# Patient Record
Sex: Male | Born: 1995 | Race: White | Hispanic: No | Marital: Single | Smoking: Never smoker
Health system: Southern US, Community
[De-identification: ages and names within clinical notes are randomized; demographics above are authoritative.]

## PROBLEM LIST (undated history)

## (undated) DIAGNOSIS — W44F3XA Food entering into or through a natural orifice, initial encounter: Secondary | ICD-10-CM

## (undated) DIAGNOSIS — T18128A Food in esophagus causing other injury, initial encounter: Secondary | ICD-10-CM

---

## 2016-08-29 ENCOUNTER — Encounter (HOSPITAL_BASED_OUTPATIENT_CLINIC_OR_DEPARTMENT_OTHER): Payer: Self-pay | Admitting: Emergency Medicine

## 2016-08-29 ENCOUNTER — Emergency Department (HOSPITAL_BASED_OUTPATIENT_CLINIC_OR_DEPARTMENT_OTHER)
Admission: EM | Admit: 2016-08-29 | Discharge: 2016-08-30 | Disposition: A | Discharge status: Home / Self Care | Payer: Self-pay | Attending: Emergency Medicine | Admitting: Emergency Medicine

## 2016-08-29 DIAGNOSIS — Y69 Unspecified misadventure during surgical and medical care: Secondary | ICD-10-CM | POA: Insufficient documentation

## 2016-08-29 DIAGNOSIS — R251 Tremor, unspecified: Secondary | ICD-10-CM | POA: Insufficient documentation

## 2016-08-29 DIAGNOSIS — T887XXA Unspecified adverse effect of drug or medicament, initial encounter: Secondary | ICD-10-CM | POA: Insufficient documentation

## 2016-08-29 DIAGNOSIS — T43615A Adverse effect of caffeine, initial encounter: Secondary | ICD-10-CM | POA: Insufficient documentation

## 2016-08-29 DIAGNOSIS — R202 Paresthesia of skin: Secondary | ICD-10-CM | POA: Insufficient documentation

## 2016-08-29 LAB — URINALYSIS, ROUTINE W REFLEX MICROSCOPIC
Bilirubin Urine: NEGATIVE
GLUCOSE, UA: NEGATIVE mg/dL
HGB URINE DIPSTICK: NEGATIVE
KETONES UR: NEGATIVE mg/dL
Leukocytes, UA: NEGATIVE
Nitrite: NEGATIVE
PROTEIN: NEGATIVE mg/dL
Specific Gravity, Urine: 1.009 (ref 1.005–1.030)
pH: 7.5 (ref 5.0–8.0)

## 2016-08-29 LAB — CBC WITH DIFFERENTIAL/PLATELET
BASOS PCT: 1 %
Basophils Absolute: 0.1 10*3/uL (ref 0.0–0.1)
EOS ABS: 0.5 10*3/uL (ref 0.0–0.7)
EOS PCT: 5 %
HCT: 44.2 % (ref 39.0–52.0)
HEMOGLOBIN: 15 g/dL (ref 13.0–17.0)
Lymphocytes Relative: 26 %
Lymphs Abs: 2.7 10*3/uL (ref 0.7–4.0)
MCH: 29.9 pg (ref 26.0–34.0)
MCHC: 33.9 g/dL (ref 30.0–36.0)
MCV: 88 fL (ref 78.0–100.0)
Monocytes Absolute: 0.7 10*3/uL (ref 0.1–1.0)
Monocytes Relative: 7 %
NEUTROS PCT: 61 %
Neutro Abs: 6.6 10*3/uL (ref 1.7–7.7)
PLATELETS: 314 10*3/uL (ref 150–400)
RBC: 5.02 MIL/uL (ref 4.22–5.81)
RDW: 12.8 % (ref 11.5–15.5)
WBC: 10.5 10*3/uL (ref 4.0–10.5)

## 2016-08-29 LAB — COMPREHENSIVE METABOLIC PANEL
ALBUMIN: 4.6 g/dL (ref 3.5–5.0)
ALK PHOS: 69 U/L (ref 38–126)
ALT: 24 U/L (ref 17–63)
ANION GAP: 9 (ref 5–15)
AST: 24 U/L (ref 15–41)
BUN: 10 mg/dL (ref 6–20)
CALCIUM: 9.2 mg/dL (ref 8.9–10.3)
CHLORIDE: 102 mmol/L (ref 101–111)
CO2: 26 mmol/L (ref 22–32)
CREATININE: 0.91 mg/dL (ref 0.61–1.24)
GFR calc non Af Amer: >60 mL/min (ref >60)
GLUCOSE: 105 mg/dL — AB (ref 65–99)
Potassium: 3.6 mmol/L (ref 3.5–5.1)
SODIUM: 137 mmol/L (ref 135–145)
Total Bilirubin: 1 mg/dL (ref 0.3–1.2)
Total Protein: 7.8 g/dL (ref 6.5–8.1)

## 2016-08-29 LAB — RAPID URINE DRUG SCREEN, HOSP PERFORMED
Amphetamines: NOT DETECTED
BARBITURATES: NOT DETECTED
BENZODIAZEPINES: NOT DETECTED
COCAINE: NOT DETECTED
Opiates: NOT DETECTED
TETRAHYDROCANNABINOL: NOT DETECTED

## 2016-08-29 LAB — SALICYLATE LEVEL

## 2016-08-29 LAB — ACETAMINOPHEN LEVEL: Acetaminophen (Tylenol), Serum: <10 ug/mL — ABNORMAL LOW (ref 10–30)

## 2016-08-29 MED ORDER — SODIUM CHLORIDE 0.9 % IV BOLUS (SEPSIS)
1000.0000 mL | Freq: Once | INTRAVENOUS | Status: AC
  Administered 2016-08-29: 1000 mL via INTRAVENOUS

## 2016-08-29 NOTE — ED Triage Notes (Signed)
Patient states that about 45 minutes prior to arrival he started to have tingling and numbness all over his body after he drank a 2nd energy drink. THe patient reports that he now only has numbness and tingling to his hands and his head  - Neuro intact, reports that he is drinking a different kind of engery drink right now. Patient has small tremors and is very anxious in triage

## 2016-08-29 NOTE — ED Provider Notes (Signed)
[facility]   CSN: [id number] Arrival date & time: 08/29/16  4098     History   Chief Complaint Chief Complaint  Patient presents with  . Tingling    HPI Erik Brooks is a 21 y.o. male.  The history is provided by the patient and medical records. No language interpreter was used.  Neurologic Problem  This is a new problem. The current episode started 6 to 12 hours ago. The problem occurs constantly. The problem has been gradually improving. Pertinent negatives include no chest pain, no abdominal pain, no headaches and no shortness of breath. Nothing aggravates the symptoms. Nothing relieves the symptoms. He has tried nothing for the symptoms. The treatment provided no relief.    History reviewed. No pertinent past medical history.  There are no active problems to display for this patient.   History reviewed. No pertinent surgical history.     Home Medications    Prior to Admission medications   Not on File    Family History History reviewed. No pertinent family history.  Social History Social History  Substance Use Topics  . Smoking status: Never Smoker  . Smokeless tobacco: Never Used  . Alcohol use Yes     Comment: 1 - 2 times a week  - last yesterday      Allergies   Patient has no known allergies.   Review of Systems Review of Systems  Constitutional: Negative for chills, diaphoresis, fatigue and fever.  HENT: Negative for congestion, rhinorrhea and tinnitus.   Eyes: Negative for photophobia and visual disturbance.  Respiratory: Negative for chest tightness, shortness of breath, wheezing and stridor.   Cardiovascular: Negative for chest pain, palpitations and leg swelling.  Gastrointestinal: Negative for abdominal pain, constipation, diarrhea, nausea and vomiting.  Genitourinary: Negative for decreased urine volume, dysuria, flank pain, frequency and hematuria.  Musculoskeletal: Negative for back pain, neck pain and neck  stiffness.  Skin: Negative for rash and wound.  Neurological: Positive for tremors and numbness. Negative for dizziness, seizures, speech difficulty, weakness, light-headedness and headaches.  Psychiatric/Behavioral: Negative for agitation.  All other systems reviewed and are negative.    Physical Exam Updated Vital Signs BP (!) 132/93 (BP Location: Right Arm)   Pulse 69   Temp 98.4 F (36.9 C) (Oral)   Resp 17   Ht 5' 5.5" (1.664 m)   Wt 59 kg (130 lb)   SpO2 100%   BMI 21.30 kg/m   Physical Exam  Constitutional: He is oriented to person, place, and time. He appears well-developed and well-nourished.  HENT:  Head: Normocephalic and atraumatic.  Mouth/Throat: Oropharynx is clear and moist. No oropharyngeal exudate.  Eyes: Pupils are equal, round, and reactive to light. Conjunctivae and EOM are normal.  Neck: Normal range of motion. Neck supple.  Cardiovascular: Normal rate and intact distal pulses.  Exam reveals no gallop.   No murmur heard. Pulmonary/Chest: Effort normal and breath sounds normal. No stridor. No respiratory distress. He has no wheezes. He exhibits no tenderness.  Abdominal: Soft. There is no tenderness.  Musculoskeletal: He exhibits no edema or tenderness.  Neurological: He is alert and oriented to person, place, and time. He displays normal reflexes. No cranial nerve deficit or sensory deficit. He exhibits normal muscle tone. Coordination normal.  Skin: Skin is warm and dry. Capillary refill takes less than 2 seconds. No erythema. No pallor.  Psychiatric: He has a normal mood and affect.  Nursing note and vitals reviewed.    ED  Treatments / Results  Labs (all labs ordered are listed, but only abnormal results are displayed) Labs Reviewed  COMPREHENSIVE METABOLIC PANEL - Abnormal; Notable for the following:       Result Value   Glucose, Bld 105 (*)    All other components within normal limits  URINALYSIS, ROUTINE W REFLEX MICROSCOPIC - Abnormal;  Notable for the following:    APPearance CLOUDY (*)    All other components within normal limits  ACETAMINOPHEN LEVEL - Abnormal; Notable for the following:    Acetaminophen (Tylenol), Serum <10 (*)    All other components within normal limits  CBC WITH DIFFERENTIAL/PLATELET  RAPID URINE DRUG SCREEN, HOSP PERFORMED  SALICYLATE LEVEL    EKG  EKG Interpretation None      ED ECG REPORT   Date: 08/29/2016  Rate: 67  Rhythm: sinus arrhythmia  QRS Axis: normal  Intervals: normal  ST/T Wave abnormalities: normal  Conduction Disutrbances:none  Narrative Interpretation:   Old EKG Reviewed: none available  I have personally reviewed the EKG tracing and agree with the computerized printout as noted.    Radiology No results found.  Procedures Procedures (including critical care time)  Medications Ordered in ED Medications  sodium chloride 0.9 % bolus 1,000 mL (1,000 mLs Intravenous New Bag/Given 08/29/16 2304)     Initial Impression / Assessment and Plan / ED Course  I have reviewed the triage vital signs and the nursing notes.  Pertinent labs & imaging results that were available during my care of the patient were reviewed by me and considered in my medical decision making (see chart for details).     Erik Brooks is a 21 y.o. male With no significant past medical history who presents with diffuse body tingling and jitteriness. Patient says that today he is felt anxious and "jittery." He says that he is felt tingling all over his body". He says that the symptoms have improved but he still feel shaky. He reports that he is consumed several energy drinks today which he does not normally do. He denies drug use or alcohol use. He denies any coingestiants. He denies any allergies. He says he is not eating much food today.  On exam, patient is a mild tremor. Patient is sitting anxiously in the bed. Patient's lungs are clear. Abdomen nontender. No focal neurologic deficits. No  tingling or sensation abnormalities.  Patient had workup to look for intoxication or ingestion. UD is negative. No evidence of significant electrolyte abnormalities. Patient given fluids during workup.  Patient reports symptoms have improved after fluids. Suspect caffeine from numerous energy drinks causing his symptoms. Patient advised to slowly decrease his caffeine intake and follow up with a PCP for further management. Do not feel patient has infection or other intoxication causing his symptoms at this time. Patient voiced understanding of return precautions and plan of care. Patient and sibling agree with caffeine de-escalation. Patient had no other questions or concerns and was discharged in good condition.    Final Clinical Impressions(s) / ED Diagnoses   Final diagnoses:  Tremor  Tingling  Adverse effect of caffeine, initial encounter    New Prescriptions There are no discharge medications for this patient.   Clinical Impression: 1. Tremor   2. Tingling   3. Adverse effect of caffeine, initial encounter     Disposition: Discharge  Condition: Good  I have discussed the results, Dx and Tx plan with the pt(& family if present). He/she/they expressed understanding and agree(s) with the plan. Discharge instructions  discussed at great length. Strict return precautions discussed and pt &/or family have verbalized understanding of the instructions. No further questions at time of discharge.    There are no discharge medications for this patient.   Follow Up: [facility] [address] [place] [address] [phone] Schedule an appointment as soon as possible for a visit    [facility] [place] EMERGENCY DEPARTMENT [address] [id number] mc [address] [place] [address] [phone]  If symptoms worsen     Tegeler, Canary Brim, MD 08/30/16 [phone]

## 2016-08-30 NOTE — Discharge Instructions (Signed)
Your workup today was reassuring. We suspect your tingling and shaking was due to high caffeine intake. Please try and cut back your energy drink consumption and follow-up with a primary care physician. If any symptoms change or worsen, please return to the nearest emergency department.

## 2018-10-25 ENCOUNTER — Emergency Department (HOSPITAL_COMMUNITY): Payer: Commercial Managed Care - PPO

## 2018-10-25 ENCOUNTER — Encounter (HOSPITAL_COMMUNITY): Payer: Self-pay | Admitting: Obstetrics and Gynecology

## 2018-10-25 ENCOUNTER — Other Ambulatory Visit: Payer: Self-pay

## 2018-10-25 ENCOUNTER — Emergency Department (HOSPITAL_COMMUNITY)
Admission: EM | Admit: 2018-10-25 | Discharge: 2018-10-25 | Disposition: A | Discharge status: Home / Self Care | Payer: Commercial Managed Care - PPO | Attending: Emergency Medicine | Admitting: Emergency Medicine

## 2018-10-25 DIAGNOSIS — Y9389 Activity, other specified: Secondary | ICD-10-CM | POA: Diagnosis not present

## 2018-10-25 DIAGNOSIS — Y929 Unspecified place or not applicable: Secondary | ICD-10-CM | POA: Diagnosis not present

## 2018-10-25 DIAGNOSIS — X58XXXA Exposure to other specified factors, initial encounter: Secondary | ICD-10-CM | POA: Insufficient documentation

## 2018-10-25 DIAGNOSIS — Y999 Unspecified external cause status: Secondary | ICD-10-CM | POA: Diagnosis not present

## 2018-10-25 DIAGNOSIS — T18128A Food in esophagus causing other injury, initial encounter: Secondary | ICD-10-CM | POA: Diagnosis not present

## 2018-10-25 HISTORY — DX: Food in esophagus causing other injury, initial encounter: T18.128A

## 2018-10-25 HISTORY — DX: Food entering into or through a natural orifice, initial encounter: W44.F3XA

## 2018-10-25 MED ORDER — GLUCAGON HCL RDNA (DIAGNOSTIC) 1 MG IJ SOLR
1.0000 mg | Freq: Once | INTRAMUSCULAR | Status: AC
  Administered 2018-10-25: 1 mg via INTRAVENOUS
  Filled 2018-10-25: qty 1

## 2018-10-25 NOTE — ED Triage Notes (Signed)
Pt reports he has a food bolus in his throat. Patient states it has been stuck x2 hours and is a steak and cheese burrito. Patient reports this has happened before and he has had to have his esophagus stretched and the impaction removed.

## 2018-10-25 NOTE — ED Provider Notes (Signed)
[facility]-EMERGENCY DEPT Provider Note   CSN: [id number] Arrival date & time: 10/25/18  1041     History   Chief Complaint Chief Complaint  Patient presents with   Food Impaction    HPI Erik Brooks is a 23 y.o. male.     HPI 23 year old male past medical history significant for food impaction presents to the emergency department today for evaluation of food bolus.  Patient reports 2 hours ago he was eating a steak and cheese burrito when he felt it get stuck in his throat.  Patient reports he has been unable to swallow his secretions or p.o. fluids since then.  He reports vomiting.  He feels like it is stuck in his throat.  Patient states this is happened before approximately 1 year ago where he had an EGD performed that showed a lower esophageal food bolus.  This was able to be passed with the scope.  Patient has not followed up with GI since then.  He has had no further problems until today.  He has tried no medications for symptoms prior to arrival.  No alleviating or aggravating factors.  Patient denies any abdominal pain, melena or hematochezia.  No fevers or chills. Past Medical History:  Diagnosis Date   Food impaction of esophagus     There are no active problems to display for this patient.   History reviewed. No pertinent surgical history.      Home Medications    Prior to Admission medications   Not on File    Family History No family history on file.  Social History Social History   Tobacco Use   Smoking status: Never Smoker   Smokeless tobacco: Never Used  Substance Use Topics   Alcohol use: Yes    Comment: 1 - 2 times a week  - last yesterday    Drug use: No     Allergies   Patient has no known allergies.   Review of Systems Review of Systems  Constitutional: Negative for chills and fever.  HENT: Positive for trouble swallowing.   Eyes: Negative for discharge.  Respiratory: Negative for cough and shortness  of breath.   Cardiovascular: Negative for chest pain.  Gastrointestinal: Negative for abdominal pain, blood in stool, diarrhea, nausea and vomiting.  Genitourinary: Negative for dysuria.  Musculoskeletal: Negative for myalgias.  Skin: Negative for rash.  Neurological: Negative for seizures, numbness and headaches.  Psychiatric/Behavioral: Negative for confusion.     Physical Exam Updated Vital Signs BP (!) 142/89 (BP Location: Right Arm)    Pulse (!) 110    Temp 98.9 F (37.2 C) (Oral)    Resp 17    SpO2 98%   Physical Exam Vitals signs and nursing note reviewed.  Constitutional:      General: He is not in acute distress.    Appearance: He is well-developed. He is not ill-appearing or toxic-appearing.  HENT:     Head: Normocephalic and atraumatic.  Eyes:     General: No scleral icterus.       Right eye: No discharge.        Left eye: No discharge.  Neck:     Musculoskeletal: Normal range of motion.  Cardiovascular:     Rate and Rhythm: Regular rhythm. Tachycardia present.     Heart sounds: Normal heart sounds.  Pulmonary:     Effort: Pulmonary effort is normal. No respiratory distress.     Breath sounds: Normal breath sounds.  Abdominal:  General: Abdomen is flat. Bowel sounds are normal. There is no distension.     Palpations: Abdomen is soft.     Tenderness: There is no abdominal tenderness. There is no guarding.  Musculoskeletal: Normal range of motion.  Skin:    General: Skin is warm and dry.     Coloration: Skin is not pale.  Neurological:     Mental Status: He is alert.  Psychiatric:        Behavior: Behavior normal.        Thought Content: Thought content normal.        Judgment: Judgment normal.      ED Treatments / Results  Labs (all labs ordered are listed, but only abnormal results are displayed) Labs Reviewed - No data to display  EKG None  Radiology Dg Chest 2 View  Result Date: 10/25/2018 CLINICAL DATA:  Food impaction EXAM: CHEST - 2  VIEW COMPARISON:  09/04/2017 FINDINGS: The heart size and mediastinal contours are within normal limits. Both lungs are clear. The visualized skeletal structures are unremarkable. IMPRESSION: Normal study. Electronically Signed   By: Charlett NoseKevin  Dover M.D.   On: 10/25/2018 12:13    Procedures Procedures (including critical care time)  Medications Ordered in ED Medications  glucagon (human recombinant) (GLUCAGEN) injection 1 mg (1 mg Intravenous Given 10/25/18 1126)     Initial Impression / Assessment and Plan / ED Course  I have reviewed the triage vital signs and the nursing notes.  Pertinent labs & imaging results that were available during my care of the patient were reviewed by me and considered in my medical decision making (see chart for details).        23 year old male with history of food impaction presents the ER for evaluation of difficulties with swallowing after eating a burrito this morning.  Patient states this feels like his last food impaction that required an EGD.  Patient reports difficulties with swallowing secretions or p.o. fluids.  Denies any other associated symptoms.  On exam patient is actively vomiting and unable to swallow his secretions.  He denies any difficulties with breathing.  Patient is not hypoxic and there is no tachypnea noted.  X-ray of the lungs reviewed by myself showed no acute abnormalities.  Patient was given glucagon and GI consultation was started.  Patient after the glucagon did have an episode of vomiting and was able to dislodge the food bolus.  Patient states that he has been able to tolerate p.o. fluids after this and has had significant improvement in his symptoms.  He reports a mild sore throat from vomiting but denies any difficulties with swallowing at this time.  He feels that the food bolus has passed.  He feels like he did prior to eating the burrito.  Patient denies any other associated pain at this time.  Again tolerating p.o. fluids.  He  was watched for approximately 45 minutes in the ER after vomiting and continues to be able to swallow his secretions and p.o. fluids.  Have given patient a GI follow-up.  Patient to take Motrin and Tylenol at home.  Discussed that if his symptoms worsen he return to the ER.  Pt is hemodynamically stable, in NAD, & able to ambulate in the ED. Evaluation does not show pathology that would require ongoing emergent intervention or inpatient treatment. I explained the diagnosis to the patient. Pain has been managed & has no complaints prior to dc. Pt is comfortable with above plan and is stable for discharge  at this time. All questions were answered prior to disposition. Strict return precautions for f/u to the ED were discussed. Encouraged follow up with PCP.   Final Clinical Impressions(s) / ED Diagnoses   Final diagnoses:  Food impaction of esophagus, initial encounter    ED Discharge Orders    None       Aaron Edelman 10/25/18 1226    Sherwood Gambler, MD 10/25/18 [phone]

## 2018-10-25 NOTE — Discharge Instructions (Addendum)
Motrin and Tylenol for pain.  Develop any further difficulties breathing or swallowing return to ER immediately.  You need to follow-up with GI doctor for a routine endoscopy or a scope down your throat.  You can also take antiacid medication such as omeprazole or Nexium.  Return to the ER with any worsening symptoms.

## 2018-10-25 NOTE — ED Notes (Signed)
RN went in to COVID swab patient. Patient reports he has vomited following glucagon administration and he believes the impaction to be gone. Patient states he is now able to swallow his own saliva and RN witnessed patient take a sip of his soda without difficulty. Patient reports throat pain, but denies feeling as if the food is still impacted.

## 2018-10-25 NOTE — ED Notes (Signed)
Patient unable to maintain his own secretions and is constantly spitting up. Patient reports that he has had to have his esophagus stretched before but is unsure if any biopsies were done last time. Patient frequently trying to get food impaction up and his voice sounds hoarse. Maintaining appropriate airway and sats

## 2020-09-18 IMAGING — CR DG CHEST 2V
2 series · 2 of 2 positions shown · non-contrast
Comparison: 09/04/2017

CLINICAL DATA: Food impaction

EXAM:
CHEST - 2 VIEW

[w chest pa]
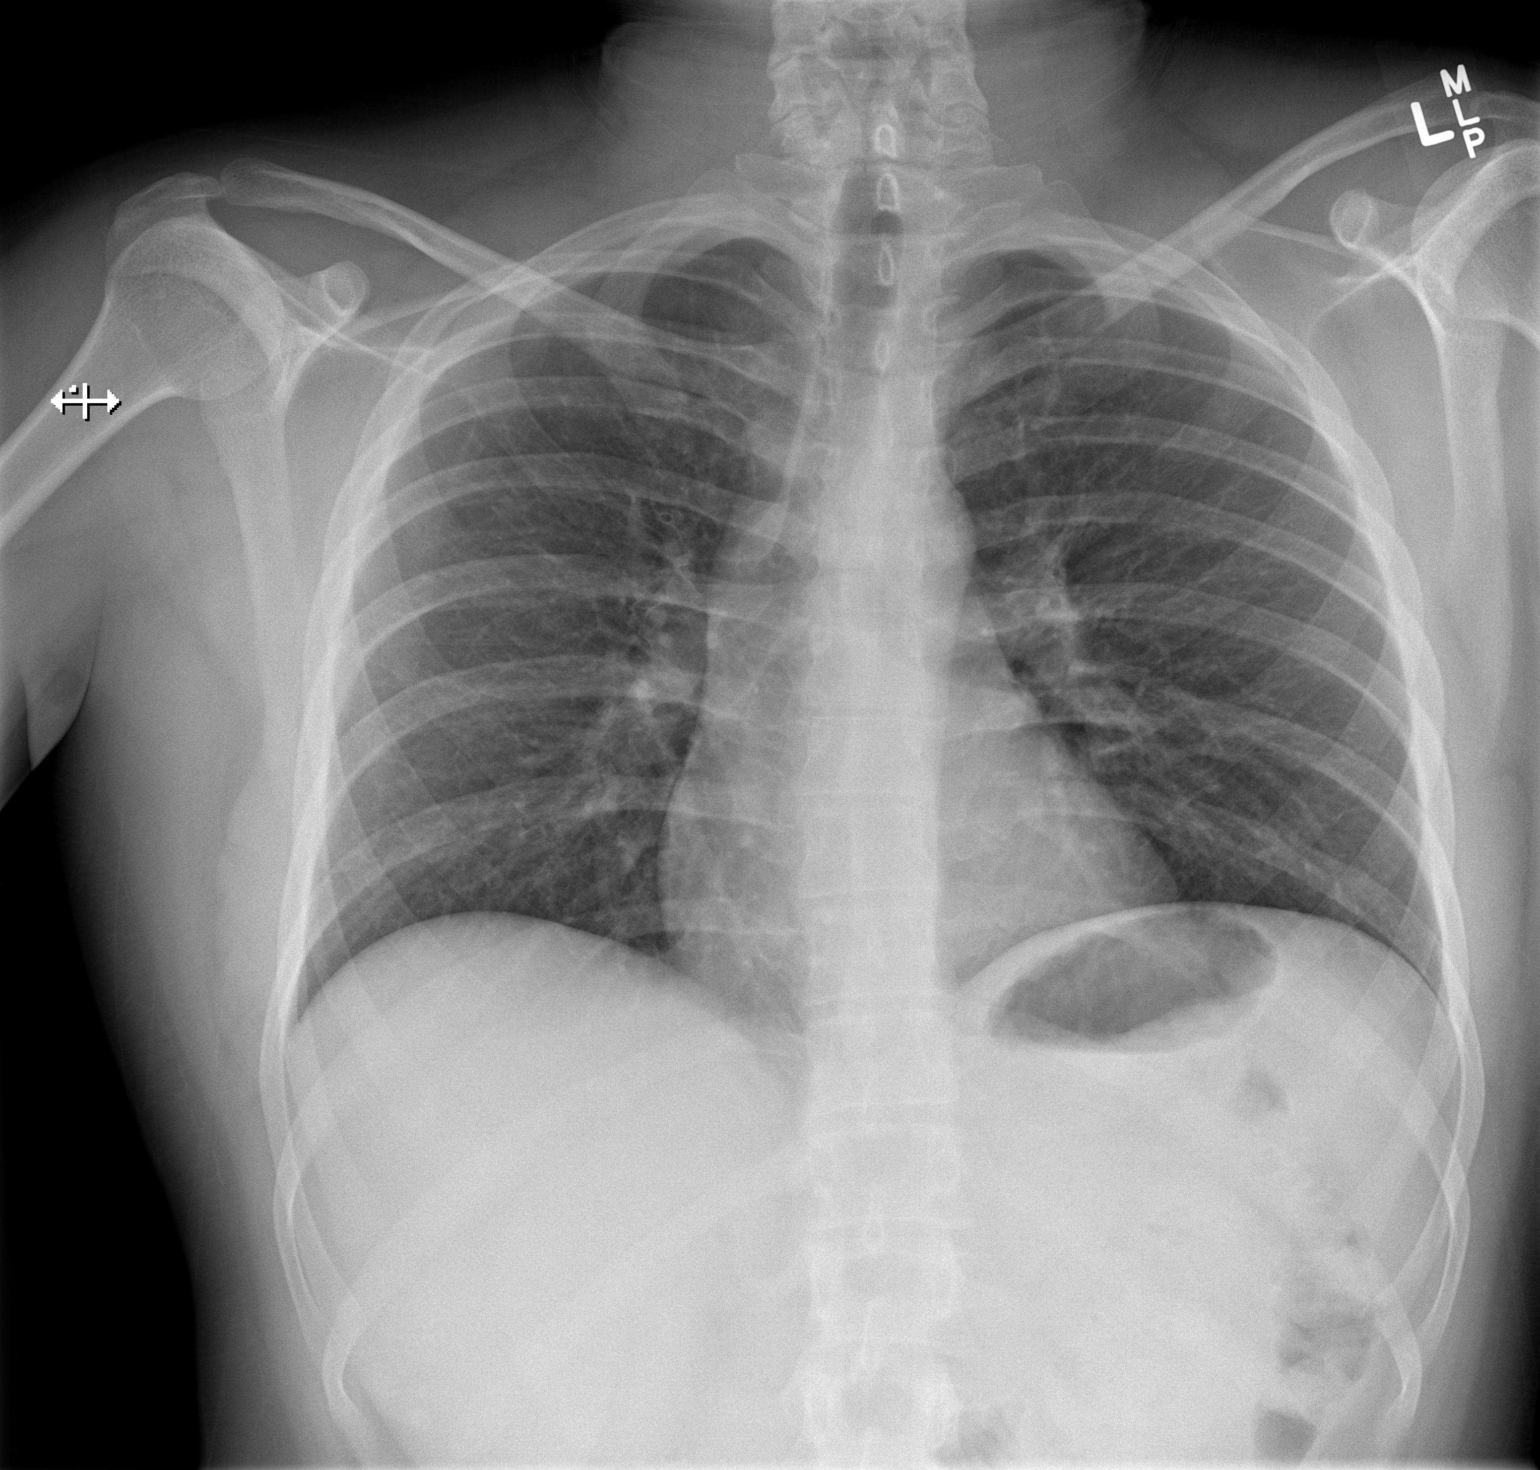

[w chest lat]
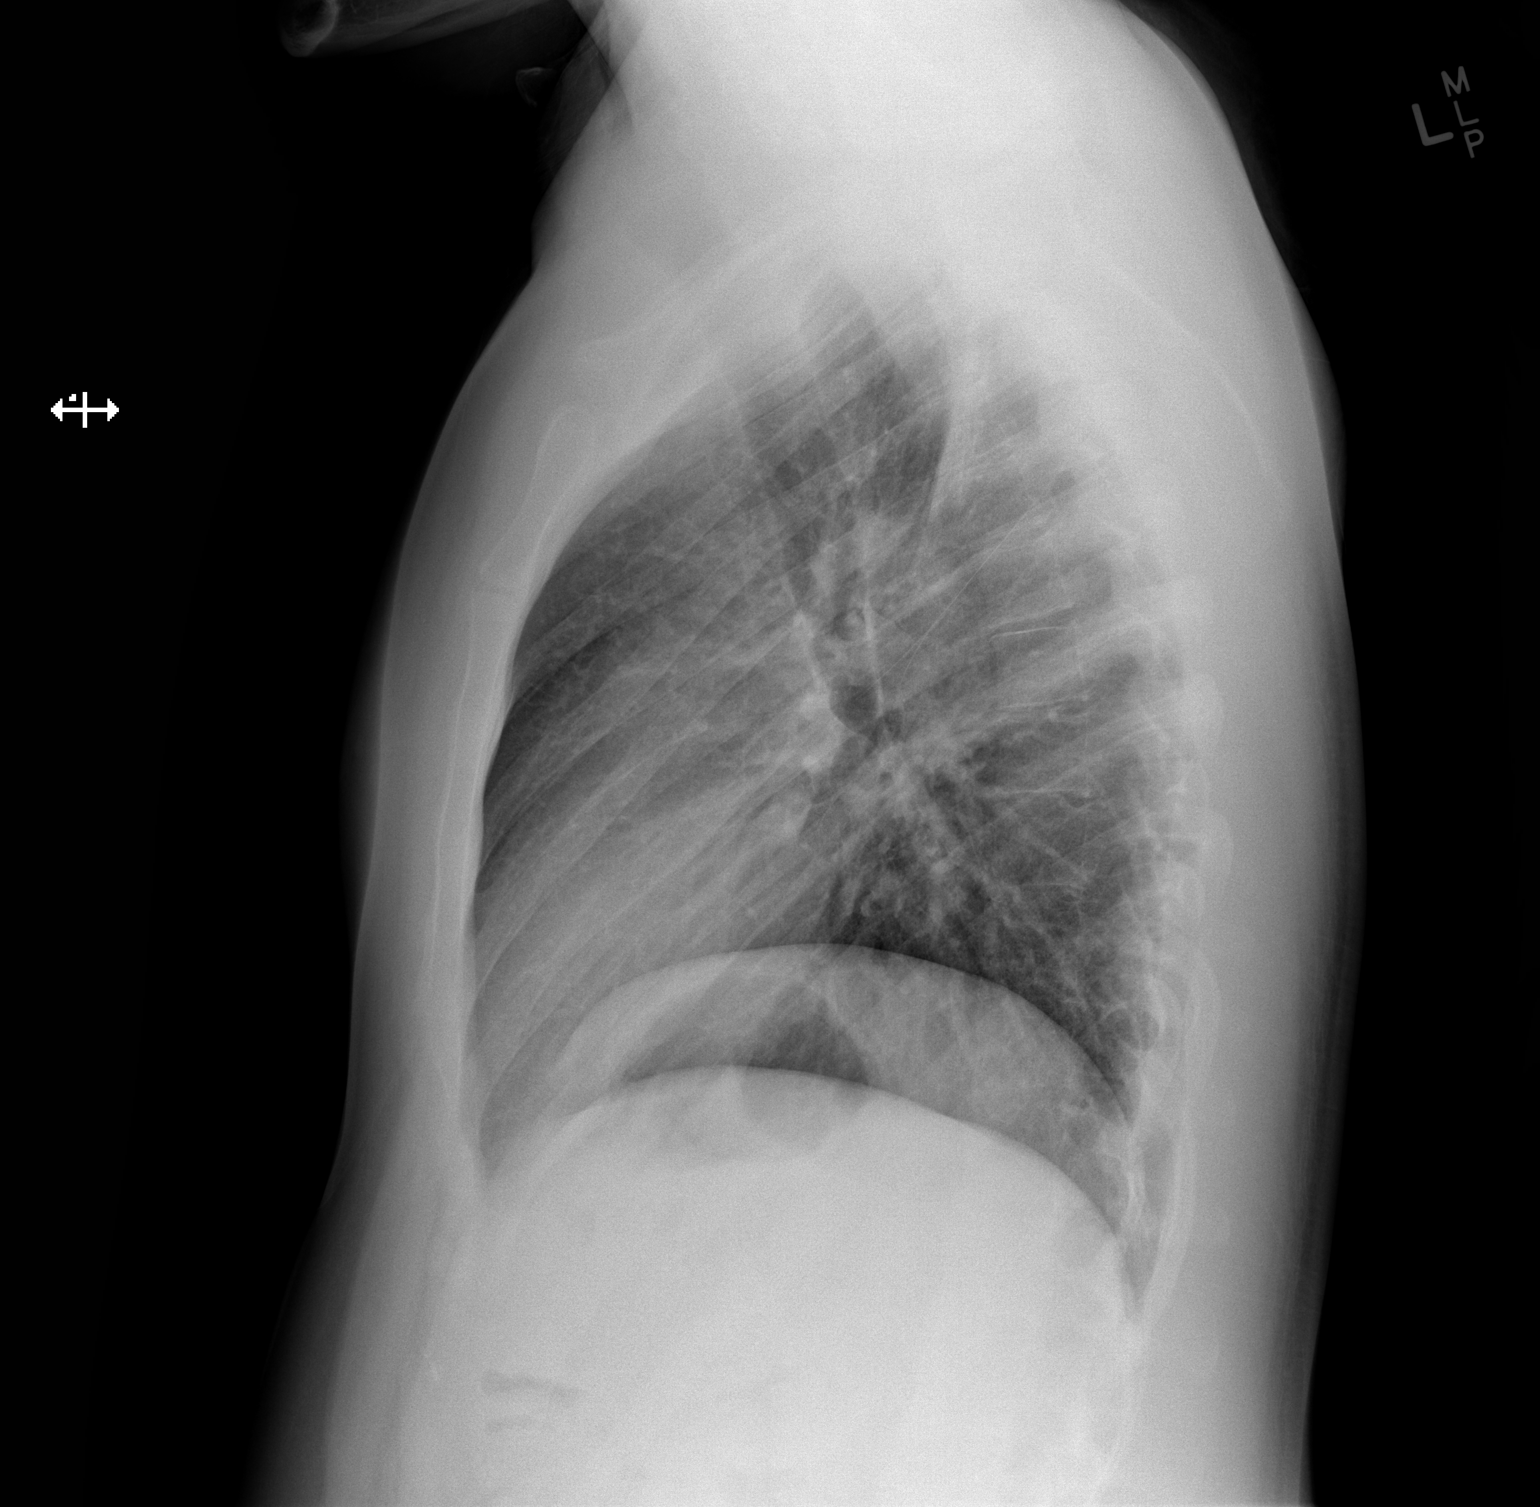

[2 of 2 positions shown; findings below may reference images not displayed]

FINDINGS: The heart size and mediastinal contours are within normal limits.
Both lungs are clear. The visualized skeletal structures are
unremarkable.
IMPRESSION: Normal study.

## 2023-03-01 ENCOUNTER — Encounter (HOSPITAL_BASED_OUTPATIENT_CLINIC_OR_DEPARTMENT_OTHER): Payer: Self-pay | Admitting: Emergency Medicine

## 2023-03-01 ENCOUNTER — Emergency Department (HOSPITAL_BASED_OUTPATIENT_CLINIC_OR_DEPARTMENT_OTHER)
Admission: EM | Admit: 2023-03-01 | Discharge: 2023-03-01 | Disposition: A | Discharge status: Home / Self Care | Payer: Self-pay | Attending: Emergency Medicine | Admitting: Emergency Medicine

## 2023-03-01 ENCOUNTER — Emergency Department (HOSPITAL_BASED_OUTPATIENT_CLINIC_OR_DEPARTMENT_OTHER): Payer: Self-pay | Admitting: Radiology

## 2023-03-01 DIAGNOSIS — W44F3XA Food entering into or through a natural orifice, initial encounter: Secondary | ICD-10-CM

## 2023-03-01 DIAGNOSIS — T18128A Food in esophagus causing other injury, initial encounter: Secondary | ICD-10-CM | POA: Insufficient documentation

## 2023-03-01 DIAGNOSIS — W448XXA Other foreign body entering into or through a natural orifice, initial encounter: Secondary | ICD-10-CM | POA: Insufficient documentation

## 2023-03-01 LAB — CBC WITH DIFFERENTIAL/PLATELET
Abs Immature Granulocytes: 0.02 10*3/uL (ref 0.00–0.07)
Basophils Absolute: 0.1 10*3/uL (ref 0.0–0.1)
Basophils Relative: 1 %
Eosinophils Absolute: 0.3 10*3/uL (ref 0.0–0.5)
Eosinophils Relative: 4 %
HCT: 47.1 % (ref 39.0–52.0)
Hemoglobin: 16 g/dL (ref 13.0–17.0)
Immature Granulocytes: 0 %
Lymphocytes Relative: 31 %
Lymphs Abs: 2.1 10*3/uL (ref 0.7–4.0)
MCH: 29.4 pg (ref 26.0–34.0)
MCHC: 34 g/dL (ref 30.0–36.0)
MCV: 86.6 fL (ref 80.0–100.0)
Monocytes Absolute: 0.4 10*3/uL (ref 0.1–1.0)
Monocytes Relative: 5 %
Neutro Abs: 4 10*3/uL (ref 1.7–7.7)
Neutrophils Relative %: 59 %
Platelets: 146 10*3/uL — ABNORMAL LOW (ref 150–400)
RBC: 5.44 MIL/uL (ref 4.22–5.81)
RDW: 12.8 % (ref 11.5–15.5)
WBC: 6.8 10*3/uL (ref 4.0–10.5)
nRBC: 0 % (ref 0.0–0.2)

## 2023-03-01 LAB — BASIC METABOLIC PANEL
Anion gap: 10 (ref 5–15)
BUN: 10 mg/dL (ref 6–20)
CO2: 25 mmol/L (ref 22–32)
Calcium: 9.8 mg/dL (ref 8.9–10.3)
Chloride: 105 mmol/L (ref 98–111)
Creatinine, Ser: 0.81 mg/dL (ref 0.61–1.24)
GFR, Estimated: >60 mL/min (ref >60)
Glucose, Bld: 85 mg/dL (ref 70–99)
Potassium: 3.6 mmol/L (ref 3.5–5.1)
Sodium: 140 mmol/L (ref 135–145)

## 2023-03-01 MED ORDER — GLUCAGON HCL RDNA (DIAGNOSTIC) 1 MG IJ SOLR
1.0000 mg | Freq: Once | INTRAMUSCULAR | Status: AC
  Administered 2023-03-01: 1 mg via INTRAVENOUS
  Filled 2023-03-01: qty 1

## 2023-03-01 MED ORDER — PANTOPRAZOLE SODIUM 40 MG PO TBEC: 40.0000 mg | DELAYED_RELEASE_TABLET | Freq: Every day | ORAL | 0 refills | Status: AC

## 2023-03-01 MED ORDER — LORAZEPAM 2 MG/ML IJ SOLN
2.0000 mg | Freq: Once | INTRAMUSCULAR | Status: AC
  Administered 2023-03-01: 2 mg via INTRAMUSCULAR
  Filled 2023-03-01: qty 1

## 2023-03-01 NOTE — Discharge Instructions (Addendum)
 Likely you have been able to spit up your steak that was lodged in your throat.  It is very important that you cut your food in Erik Brooks pieces that this does not happen again.  You will need to follow-up with GI, as this is a recurrent issue now.  Please take the pantoprazole, as instructed, to help with the acid in your stomach, from a road in your esophagus, while your esophagus is healing.  Return to the ER if you have severe chest pain, shortness of breath, or difficulty swallowing.

## 2023-03-01 NOTE — ED Triage Notes (Signed)
 Was eating steak today around 3pm Feels  like it is stuck,  Spitting in triage. Sore throat Has had similar and needed removal with esoph stretching

## 2023-03-01 NOTE — ED Triage Notes (Signed)
 BIB [facility]. Choked on steak while working at [employer]. Has happened before and food needed to be removed in hospital. Airway intact- spitting occasionally. Feels food in throat still.

## 2023-03-01 NOTE — ED Notes (Signed)
 Discharge paperwork given and verbally understood.

## 2023-03-01 NOTE — ED Provider Notes (Signed)
 [place] EMERGENCY DEPARTMENT AT [facility] Provider Note   CSN: [id number] Arrival date & time: 03/01/23  1625     History  Chief Complaint  Patient presents with   Food Bolus     Erik Brooks is a 28 y.o. male, history of food impaction of esophagus, who presents to the ED secondary to a feeling like specie steak stuck in his throat, for the last 2 hours.  He states around 3 PM today he was eating steak, and feels like it is stuck.  He reports he cannot swallow and is back, and he has been spitting up his spit.  Reports that when he had this in the past, he has had to have removal, and esophagus stretching.  Home Medications Prior to Admission medications   Medication Sig Start Date End Date Taking? Authorizing Provider  pantoprazole (PROTONIX) 40 MG tablet Take 1 tablet (40 mg total) by mouth daily. 03/01/23  Yes Shahzaib Azevedo L, PA      Allergies    Shellfish allergy    Review of Systems   Review of Systems  Constitutional:  Negative for fever.  Cardiovascular:  Negative for chest pain.    Physical Exam Updated Vital Signs BP 115/86 (BP Location: Right Arm)   Pulse 94   Temp 98.6 F (37 C) (Oral)   Resp 18   SpO2 96%  Physical Exam Vitals and nursing note reviewed.  Constitutional:      General: He is not in acute distress.    Appearance: He is well-developed.  HENT:     Head: Normocephalic and atraumatic.     Mouth/Throat:     Comments: No visible obstruction noted in the mouth.  Uvula midline.  Unable to tolerate secretions, spitting it into a bag. Eyes:     Conjunctiva/sclera: Conjunctivae normal.  Cardiovascular:     Rate and Rhythm: Normal rate and regular rhythm.     Heart sounds: No murmur heard. Pulmonary:     Effort: Pulmonary effort is normal. No respiratory distress.     Breath sounds: Normal breath sounds.  Abdominal:     Palpations: Abdomen is soft.     Tenderness: There is no abdominal tenderness.  Musculoskeletal:         General: No swelling.     Cervical back: Neck supple.  Skin:    General: Skin is warm and dry.     Capillary Refill: Capillary refill takes less than 2 seconds.  Neurological:     Mental Status: He is alert.  Psychiatric:        Mood and Affect: Mood normal.     ED Results / Procedures / Treatments   Labs (all labs ordered are listed, but only abnormal results are displayed) Labs Reviewed  CBC WITH DIFFERENTIAL/PLATELET - Abnormal; Notable for the following components:      Result Value   Platelets 146 (*)    All other components within normal limits  BASIC METABOLIC PANEL    EKG None  Radiology DG Chest 2 View Result Date: 03/01/2023 CLINICAL DATA:  Food bolus. Choked on state. Feels like food stuck in throat. EXAM: CHEST - 2 VIEW COMPARISON:  None Available. FINDINGS: The heart size and mediastinal contours are within normal limits. Both lungs are clear. The visualized skeletal structures are unremarkable. No radiopaque foreign body. IMPRESSION: Negative. Electronically Signed   By: Charlett Nose M.D.   On: 03/01/2023 20:07   DG Neck Soft Tissue Result Date: 03/01/2023 CLINICAL DATA:  Food bolus.  Choked on state. EXAM: NECK SOFT TISSUES - 1+ VIEW COMPARISON:  None Available. FINDINGS: There is no evidence of retropharyngeal soft tissue swelling or epiglottic enlargement. The cervical airway is unremarkable and no radio-opaque foreign body identified. IMPRESSION: Negative. Electronically Signed   By: Charlett Nose M.D.   On: 03/01/2023 20:06    Procedures Procedures    Medications Ordered in ED Medications  LORazepam (ATIVAN) injection 2 mg (2 mg Intramuscular Given 03/01/23 1749)  glucagon (human recombinant) (GLUCAGEN) injection 1 mg (1 mg Intravenous Given 03/01/23 1844)    ED Course/ Medical Decision Making/ A&P                                 Medical Decision Making Patient is a 28 year old male, unable to tolerate secretions, here for foreign body, that is lodged  in his throat, occurred after eating a few bites of steak, today.  He is unable to tolerate secretions, we trialed Ativan, without relief.  I called Dr. Bosie Clos, and he states tried glucagon, and then called me back, stating to send the patient over to [facility], after reeval, patient actually coughed up steak, and is able to tolerate secretions now, I called Dr. Bosie Clos to let him know.  Blood work was obtained, due to initial plans, to send for endoscopy.  Chest x-ray, and neck x-ray were negative.  Discharged home, strict return precautions after successful p.o. trial.  Currently has no pain, no chest pain, or blood in emesis.  Sent with follow-up to GI, and pantoprazole, to help reduce acid while esophagus healing  Amount and/or Complexity of Data Reviewed Labs: ordered.    Details: Blood work unremarkable Radiology: ordered.    Details: Chest x-ray, soft tissue neck unremarkable Discussion of management or test interpretation with external provider(s): See above, discharged home after successful p.o. trial, and passage of food bolus.  Risk Prescription drug management.  Clinical Impression(s) / ED Diagnoses Final diagnoses:  Food impaction of esophagus, initial encounter    Rx / DC Orders ED Discharge Orders          Ordered    pantoprazole (PROTONIX) 40 MG tablet  Daily        03/01/23 1958              Rahi Chandonnet Elbert Ewings, PA 03/01/23 2015    Rozelle Logan, DO 03/01/23 2333

## 2023-07-18 ENCOUNTER — Other Ambulatory Visit: Payer: Self-pay

## 2023-07-18 ENCOUNTER — Encounter (HOSPITAL_COMMUNITY): Payer: Self-pay

## 2023-07-18 ENCOUNTER — Ambulatory Visit (HOSPITAL_COMMUNITY)
Admission: RE | Admit: 2023-07-18 | Discharge: 2023-07-18 | Disposition: A | Discharge status: Home / Self Care | Payer: Self-pay | Source: Ambulatory Visit | Attending: Family Medicine | Admitting: Family Medicine

## 2023-07-18 VITALS — BP 118/72 | HR 100 | Temp 98.2°F | Resp 18

## 2023-07-18 DIAGNOSIS — H9201 Otalgia, right ear: Secondary | ICD-10-CM

## 2023-07-18 MED ORDER — IBUPROFEN 400 MG PO TABS: 400.0000 mg | ORAL_TABLET | Freq: Three times a day (TID) | ORAL | 0 refills | Status: AC | PRN

## 2023-07-18 NOTE — ED Triage Notes (Signed)
 Pt is here c/o right side ear pain for the past few days.

## 2023-07-18 NOTE — ED Provider Notes (Signed)
 [facility]    CSN: [id number] Arrival date & time: 07/18/23  1528      History   Chief Complaint Chief Complaint  Patient presents with   Otalgia    HPI Erik Brooks is a 28 y.o. male.   The history is provided by the patient. No language interpreter was used.  Otalgia Location:  Right Quality:  Dull Severity:  Moderate Onset quality:  Gradual Duration:  2 days Timing:  Intermittent Progression:  Improving Chronicity:  New Context: not direct blow   Context comment:  Denies sick contact Relieved by: Rest and drinking water. Worsened by:  Nothing Associated symptoms: no cough, no ear discharge, no fever, no sore throat and no vomiting     Past Medical History:  Diagnosis Date   Food impaction of esophagus     There are no active problems to display for this patient.   History reviewed. No pertinent surgical history.     Home Medications    Prior to Admission medications   Medication Sig Start Date End Date Taking? Authorizing Provider  ibuprofen  (ADVIL ) 400 MG tablet Take 1 tablet (400 mg total) by mouth every 8 (eight) hours as needed. 07/18/23  Yes [name], MD  pantoprazole  (PROTONIX ) 40 MG tablet Take 1 tablet (40 mg total) by mouth daily. 03/01/23   Small, Lyle CROME, PA    Family History History reviewed. No pertinent family history.  Social History Social History   Tobacco Use   Smoking status: Never   Smokeless tobacco: Never  Substance Use Topics   Alcohol use: Yes    Comment: 1 - 2 times a week  - last yesterday    Drug use: No     Allergies   Shellfish allergy   Review of Systems Review of Systems  Constitutional:  Negative for fever.  HENT:  Positive for ear pain. Negative for ear discharge and sore throat.   Respiratory:  Negative for cough.   Gastrointestinal:  Negative for vomiting.  All other systems reviewed and are negative.    Physical Exam Triage Vital Signs ED Triage Vitals  Encounter Vitals  Group     BP      Girls Systolic BP Percentile      Girls Diastolic BP Percentile      Boys Systolic BP Percentile      Boys Diastolic BP Percentile      Pulse      Resp      Temp      Temp src      SpO2      Weight      Height      Head Circumference      Peak Flow      Pain Score      Pain Loc      Pain Education      Exclude from Growth Chart    No data found.  Updated Vital Signs BP 118/72 (BP Location: Right Arm)   Pulse 100   Temp 98.2 F (36.8 C) (Oral)   Resp 18   SpO2 98%   Visual Acuity Right Eye Distance:   Left Eye Distance:   Bilateral Distance:    Right Eye Near:   Left Eye Near:    Bilateral Near:     Physical Exam Vitals and nursing note reviewed.  Constitutional:      Appearance: He is not ill-appearing or toxic-appearing.  HENT:     Right Ear: Tympanic membrane  and external ear normal. There is no impacted cerumen.     Left Ear: Tympanic membrane, ear canal and external ear normal. There is no impacted cerumen.     Ears:     Comments: Mild inflammation of his right ear canal membrane. No discharge or TM abnormality Eyes:     Conjunctiva/sclera: Conjunctivae normal.  Cardiovascular:     Rate and Rhythm: Normal rate and regular rhythm.     Heart sounds: Normal heart sounds. No murmur heard. Pulmonary:     Effort: Pulmonary effort is normal. No respiratory distress.     Breath sounds: Normal breath sounds. No wheezing or rhonchi.  Musculoskeletal:     Cervical back: Neck supple. No tenderness.  Lymphadenopathy:     Cervical: No cervical adenopathy.  Neurological:     Mental Status: He is alert.      UC Treatments / Results  Labs (all labs ordered are listed, but only abnormal results are displayed) Labs Reviewed - No data to display  EKG   Radiology No results found.  Procedures Procedures (including critical care time)  Medications Ordered in UC Medications - No data to display  Initial Impression / Assessment and Plan  / UC Course  I have reviewed the triage vital signs and the nursing notes.  Pertinent labs & imaging results that were available during my care of the patient were reviewed by me and considered in my medical decision making (see chart for details).  Clinical Course as of 07/18/23 1551  Sun Jul 18, 2023  1551 Viral otalgia Ibuprofen  prn pain escribed Return precautions discussed He agreed with the plan [KE]    Clinical Course User Index [KE] [name], MD     Final Clinical Impressions(s) / UC Diagnoses   Final diagnoses:  Right ear pain     Discharge Instructions      It was nice seeing you today. Your right ear canal is very mildly inflamed with no discharge or redness of your tympanic membrane. This is suggestive of viral illness. I sent in pain medication to your pharmacy. Antibiotic is not needed. See us  soon if there is no further resolution of your symptoms.      ED Prescriptions     Medication Sig Dispense Auth. Provider   ibuprofen  (ADVIL ) 400 MG tablet Take 1 tablet (400 mg total) by mouth every 8 (eight) hours as needed. 30 tablet [name], MD      PDMP not reviewed this encounter.   [name], MD 07/18/23 [phone]

## 2023-07-18 NOTE — Discharge Instructions (Addendum)
 It was nice seeing you today. Your right ear canal is very mildly inflamed with no discharge or redness of your tympanic membrane. This is suggestive of viral illness. I sent in pain medication to your pharmacy. Antibiotic is not needed. See us  soon if there is no further resolution of your symptoms.

## 2023-08-25 ENCOUNTER — Ambulatory Visit (HOSPITAL_COMMUNITY): Payer: Self-pay

## 2023-08-28 ENCOUNTER — Other Ambulatory Visit: Payer: Self-pay

## 2023-08-28 ENCOUNTER — Emergency Department (HOSPITAL_COMMUNITY): Payer: Self-pay

## 2023-08-28 ENCOUNTER — Emergency Department (HOSPITAL_COMMUNITY)
Admission: EM | Admit: 2023-08-28 | Discharge: 2023-08-28 | Disposition: A | Discharge status: Home / Self Care | Payer: Self-pay | Attending: Emergency Medicine | Admitting: Emergency Medicine

## 2023-08-28 DIAGNOSIS — R079 Chest pain, unspecified: Secondary | ICD-10-CM

## 2023-08-28 DIAGNOSIS — R0789 Other chest pain: Secondary | ICD-10-CM | POA: Insufficient documentation

## 2023-08-28 DIAGNOSIS — R110 Nausea: Secondary | ICD-10-CM | POA: Insufficient documentation

## 2023-08-28 LAB — BASIC METABOLIC PANEL WITH GFR
Anion gap: 9 (ref 5–15)
BUN: 8 mg/dL (ref 6–20)
CO2: 23 mmol/L (ref 22–32)
Calcium: 8.7 mg/dL — ABNORMAL LOW (ref 8.9–10.3)
Chloride: 105 mmol/L (ref 98–111)
Creatinine, Ser: 0.94 mg/dL (ref 0.61–1.24)
GFR, Estimated: >60 mL/min (ref >60)
Glucose, Bld: 123 mg/dL — ABNORMAL HIGH (ref 70–99)
Potassium: 3.5 mmol/L (ref 3.5–5.1)
Sodium: 137 mmol/L (ref 135–145)

## 2023-08-28 LAB — CBC
HCT: 44.1 % (ref 39.0–52.0)
Hemoglobin: 15.1 g/dL (ref 13.0–17.0)
MCH: 29.5 pg (ref 26.0–34.0)
MCHC: 34.2 g/dL (ref 30.0–36.0)
MCV: 86.1 fL (ref 80.0–100.0)
Platelets: 259 K/uL (ref 150–400)
RBC: 5.12 MIL/uL (ref 4.22–5.81)
RDW: 12 % (ref 11.5–15.5)
WBC: 6 K/uL (ref 4.0–10.5)
nRBC: 0 % (ref 0.0–0.2)

## 2023-08-28 LAB — TROPONIN I (HIGH SENSITIVITY)
Troponin I (High Sensitivity): <2 ng/L (ref <18)
Troponin I (High Sensitivity): <2 ng/L (ref <18)

## 2023-08-28 LAB — D-DIMER, QUANTITATIVE: D-Dimer, Quant: <0.27 ug{FEU}/mL (ref 0.00–0.50)

## 2023-08-28 MED ORDER — HYDROXYZINE HCL 25 MG PO TABS: 25.0000 mg | ORAL_TABLET | Freq: Four times a day (QID) | ORAL | 0 refills | Status: AC

## 2023-08-28 MED ORDER — SODIUM CHLORIDE 0.9 % IV BOLUS
1000.0000 mL | Freq: Once | INTRAVENOUS | Status: AC
  Administered 2023-08-28: 1000 mL via INTRAVENOUS

## 2023-08-28 MED ORDER — LORAZEPAM 2 MG/ML IJ SOLN
1.0000 mg | Freq: Once | INTRAMUSCULAR | Status: AC
  Administered 2023-08-28: 1 mg via INTRAVENOUS
  Filled 2023-08-28: qty 1

## 2023-08-28 NOTE — Discharge Instructions (Signed)
 As we discussed we did not find anything sinister on your lab work today which is great news. I have prescribed you a medication that I would like for you to take if you have another episode to see if this is more related to anxiety. I have also placed an ambulatory referral to cardiology. They should contact you to schedule an appointment. Please return to the ED for any worsening symptoms.

## 2023-08-28 NOTE — ED Provider Notes (Signed)
 [place] EMERGENCY DEPARTMENT AT [facility] Provider Note   CSN: [id number] Arrival date & time: 08/28/23  1733     Patient presents with: Chest Pain   Erik Brooks is a 28 y.o. male patient who presents to the emergency department today for further evaluation of intermittent chest pain over the last 4 days.  Patient states that it is sometimes worse with exertion and with deep respirations.  He states when it lays down it is primarily on the right side when he is exerting himself it is on the left side.  Describes as a dull yet sharp sensation.  Denies any shortness of breath, cough, fever, chills, sore throat.  Does endorse some nausea without vomiting. He states he has undiagnosed anxiety and does have some times where he does feel anxious.     Chest Pain      Prior to Admission medications   Medication Sig Start Date End Date Taking? Authorizing Provider  hydrOXYzine  (ATARAX ) 25 MG tablet Take 1 tablet (25 mg total) by mouth every 6 (six) hours. 08/28/23  Yes Theotis Peers M, PA-C  ibuprofen  (ADVIL ) 400 MG tablet Take 1 tablet (400 mg total) by mouth every 8 (eight) hours as needed. 07/18/23   [name], MD  pantoprazole  (PROTONIX ) 40 MG tablet Take 1 tablet (40 mg total) by mouth daily. 03/01/23   Small, Brooke L, PA    Allergies: Shellfish allergy    Review of Systems  Cardiovascular:  Positive for chest pain.  All other systems reviewed and are negative.   Updated Vital Signs BP (!) 129/91   Pulse (!) 102   Temp 98.5 F (36.9 C) (Oral)   Resp 15   SpO2 98%   Physical Exam Vitals and nursing note reviewed.  Constitutional:      General: He is not in acute distress.    Appearance: Normal appearance.  HENT:     Head: Normocephalic and atraumatic.  Eyes:     General:        Right eye: No discharge.        Left eye: No discharge.  Cardiovascular:     Rate and Rhythm: Tachycardia present.     Comments: S1/S2 are distinct without any  evidence of murmur, rubs, or gallops.  Radial pulses are 2+ bilaterally.  Dorsalis pedis pulses are 2+ bilaterally.  No evidence of pedal edema. Pulmonary:     Comments: Clear to auscultation bilaterally.  Normal effort.  No respiratory distress.  No evidence of wheezes, rales, or rhonchi heard throughout. Abdominal:     General: Abdomen is flat. Bowel sounds are normal. There is no distension.     Tenderness: There is no abdominal tenderness. There is no guarding or rebound.  Musculoskeletal:        General: Normal range of motion.     Cervical back: Neck supple.  Skin:    General: Skin is warm and dry.     Findings: No rash.  Neurological:     General: No focal deficit present.     Mental Status: He is alert.  Psychiatric:        Mood and Affect: Mood normal.        Behavior: Behavior normal.     (all labs ordered are listed, but only abnormal results are displayed) Labs Reviewed  BASIC METABOLIC PANEL WITH GFR - Abnormal; Notable for the following components:      Result Value   Glucose, Bld 123 (*)  Calcium 8.7 (*)    All other components within normal limits  CBC  D-DIMER, QUANTITATIVE  TROPONIN I (HIGH SENSITIVITY)  TROPONIN I (HIGH SENSITIVITY)    EKG: None  Radiology: DG Chest 2 View Result Date: 08/28/2023 CLINICAL DATA:  Intermittent chest pain since Tuesday. EXAM: CHEST - 2 VIEW COMPARISON:  03/01/2023. FINDINGS: The heart size and mediastinal contours are within normal limits. Lung volumes are low. No consolidation, effusion, or pneumothorax is seen. No acute osseous abnormality is seen. IMPRESSION: No active cardiopulmonary disease. Electronically Signed   By: Leita Birmingham M.D.   On: 08/28/2023 18:19     Procedures   Medications Ordered in the ED  LORazepam  (ATIVAN ) injection 1 mg (1 mg Intravenous Given 08/28/23 1827)  sodium chloride  0.9 % bolus 1,000 mL (1,000 mLs Intravenous New Bag/Given 08/28/23 1937)    Clinical Course as of 08/28/23 2039  Sat  Aug 28, 2023  2008 D-dimer, quantitative Negative.  [CF]  2008 Troponin I (High Sensitivity) Initial troponin was negative.  [CF]  2008 CBC Negative.  [CF]  2008 Basic metabolic panel(!) Negative.  [CF]  2029 DG Chest 2 View Negative.  [CF]    Clinical Course User Index [CF] Theotis Cameron HERO, PA-C    Medical Decision Making Erik Brooks is a 28 y.o. male patient who presents to the emergency department today for further evaluation of chest pain.  Patient is tachycardic.  Minimally reproducible with palpation of the chest wall.  Will D-dimer.  Will also look for signs of ACS.  EKG does show sinus tachycardia no obvious signs of ischemia.  No signs of pulm embolism, ACS.  Patient's tachycardia was resolved with fluids and Ativan .  Likely anxiety related.  Patient did have no evidence of tachycardia while he was resting on his phone in the room.  When I started going over results with him his heart rate did begin to elevate.  Will send him an amatory referral for cardiology.  I will prescribe him some Atarax  to see if this is more anxiety related.  Strict turn precautions were discussed.  He is safe for discharge.  Amount and/or Complexity of Data Reviewed Labs: ordered. Decision-making details documented in ED Course. Radiology: ordered. Decision-making details documented in ED Course.  Risk Prescription drug management.     Final diagnoses:  Chest pain, unspecified type    ED Discharge Orders          Ordered    hydrOXYzine  (ATARAX ) 25 MG tablet  Every 6 hours        08/28/23 2035    Ambulatory referral to Cardiology        08/28/23 2035               Theotis Cameron HERO DEVONNA 08/28/23 2039    Patsey Lot, MD 08/28/23 2250

## 2023-08-28 NOTE — ED Triage Notes (Signed)
 Pt came in for chest pain since Tuesday. Pt stated it feels like his heart is going to explode.

## 2023-08-29 ENCOUNTER — Ambulatory Visit (HOSPITAL_COMMUNITY): Payer: Self-pay
